# Patient Record
Sex: Male | Born: 1965 | Race: Black or African American | Hispanic: No | Marital: Single | Smoking: Current every day smoker
Health system: Southern US, Community
[De-identification: ages and names within clinical notes are randomized; demographics above are authoritative.]

## PROBLEM LIST (undated history)

## (undated) DIAGNOSIS — I82409 Acute embolism and thrombosis of unspecified deep veins of unspecified lower extremity: Secondary | ICD-10-CM

## (undated) DIAGNOSIS — I2699 Other pulmonary embolism without acute cor pulmonale: Secondary | ICD-10-CM

## (undated) HISTORY — PX: BACK SURGERY: SHX140

---

## 1999-09-02 ENCOUNTER — Emergency Department (HOSPITAL_COMMUNITY): Admission: EM | Admit: 1999-09-02 | Discharge: 1999-09-02 | Payer: Self-pay | Admitting: Emergency Medicine

## 2004-05-06 ENCOUNTER — Emergency Department (HOSPITAL_COMMUNITY): Admission: EM | Admit: 2004-05-06 | Discharge: 2004-05-06 | Payer: Self-pay | Admitting: Emergency Medicine

## 2004-06-09 ENCOUNTER — Emergency Department (HOSPITAL_COMMUNITY): Admission: EM | Admit: 2004-06-09 | Discharge: 2004-06-09 | Payer: Self-pay | Admitting: Emergency Medicine

## 2005-10-06 ENCOUNTER — Emergency Department (HOSPITAL_COMMUNITY): Admission: EM | Admit: 2005-10-06 | Discharge: 2005-10-07 | Payer: Self-pay | Admitting: Emergency Medicine

## 2007-03-06 ENCOUNTER — Emergency Department (HOSPITAL_COMMUNITY): Admission: EM | Admit: 2007-03-06 | Discharge: 2007-03-06 | Payer: Self-pay | Admitting: Family Medicine

## 2007-05-31 ENCOUNTER — Emergency Department (HOSPITAL_COMMUNITY): Admission: EM | Admit: 2007-05-31 | Discharge: 2007-05-31 | Payer: Self-pay | Admitting: Emergency Medicine

## 2007-11-07 ENCOUNTER — Emergency Department (HOSPITAL_COMMUNITY): Admission: EM | Admit: 2007-11-07 | Discharge: 2007-11-07 | Payer: Self-pay | Admitting: Family Medicine

## 2008-01-02 ENCOUNTER — Emergency Department (HOSPITAL_COMMUNITY): Admission: EM | Admit: 2008-01-02 | Discharge: 2008-01-02 | Payer: Self-pay | Admitting: Family Medicine

## 2008-04-19 ENCOUNTER — Encounter: Admission: RE | Admit: 2008-04-19 | Discharge: 2008-04-19 | Payer: Self-pay | Admitting: Internal Medicine

## 2008-07-13 ENCOUNTER — Emergency Department (HOSPITAL_COMMUNITY): Admission: EM | Admit: 2008-07-13 | Discharge: 2008-07-13 | Payer: Self-pay | Admitting: Emergency Medicine

## 2008-07-17 ENCOUNTER — Emergency Department (HOSPITAL_COMMUNITY): Admission: EM | Admit: 2008-07-17 | Discharge: 2008-07-18 | Payer: Self-pay | Admitting: *Deleted

## 2008-08-04 ENCOUNTER — Emergency Department (HOSPITAL_COMMUNITY): Admission: EM | Admit: 2008-08-04 | Discharge: 2008-08-04 | Payer: Self-pay | Admitting: Emergency Medicine

## 2008-08-15 ENCOUNTER — Ambulatory Visit: Payer: Self-pay | Admitting: Internal Medicine

## 2008-08-20 ENCOUNTER — Ambulatory Visit (HOSPITAL_COMMUNITY): Admission: RE | Admit: 2008-08-20 | Discharge: 2008-08-20 | Payer: Self-pay | Admitting: Internal Medicine

## 2008-08-27 ENCOUNTER — Ambulatory Visit: Payer: Self-pay | Admitting: Family Medicine

## 2008-09-05 ENCOUNTER — Inpatient Hospital Stay (HOSPITAL_COMMUNITY): Admission: RE | Admit: 2008-09-05 | Discharge: 2008-09-06 | Payer: Self-pay | Admitting: Neurological Surgery

## 2008-09-05 ENCOUNTER — Encounter (INDEPENDENT_AMBULATORY_CARE_PROVIDER_SITE_OTHER): Payer: Self-pay | Admitting: Neurological Surgery

## 2008-09-10 ENCOUNTER — Inpatient Hospital Stay (HOSPITAL_COMMUNITY): Admission: EM | Admit: 2008-09-10 | Discharge: 2008-09-13 | Payer: Self-pay | Admitting: Emergency Medicine

## 2008-12-26 ENCOUNTER — Encounter: Admission: RE | Admit: 2008-12-26 | Discharge: 2009-03-26 | Payer: Self-pay | Admitting: Neurological Surgery

## 2010-07-18 IMAGING — CR DG LUMBAR SPINE COMPLETE 4+V
5 series · 5 of 5 positions shown · non-contrast
Comparison: None.

CLINICAL DATA: Pain and legs.  No history trauma provided.

LUMBAR SPINE - COMPLETE 4+ VIEW

[t l-spine a.p.]
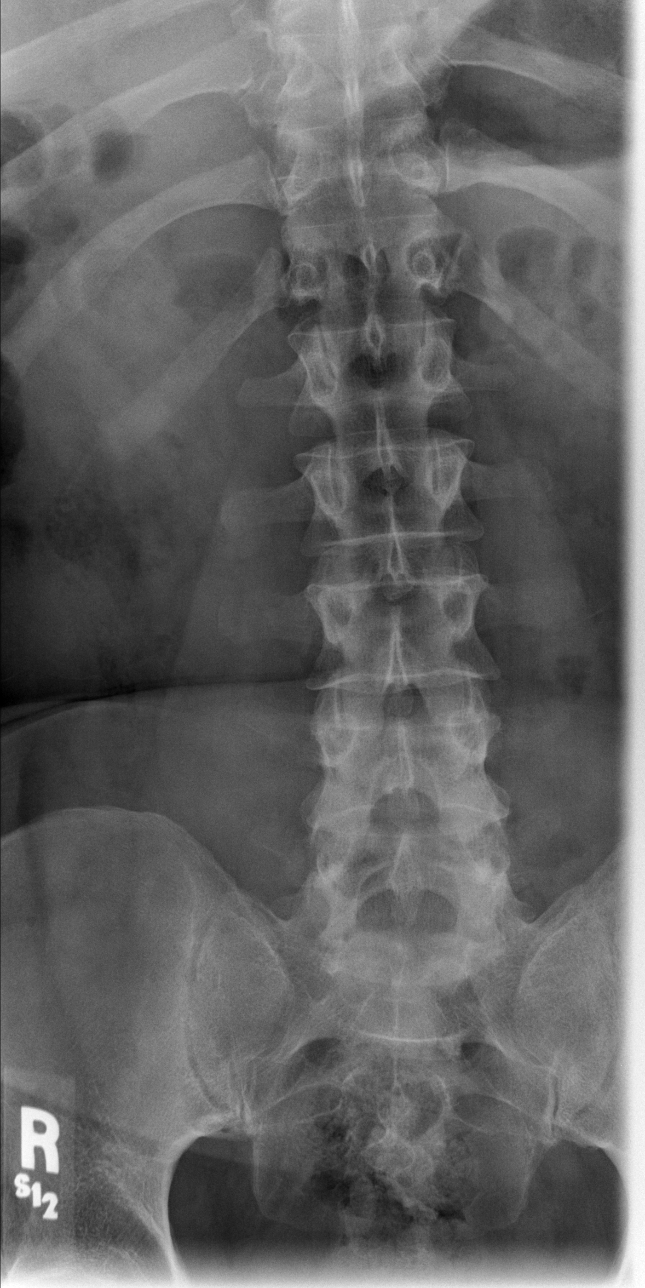

[t l-spine oblique exposure (1 of 2)]
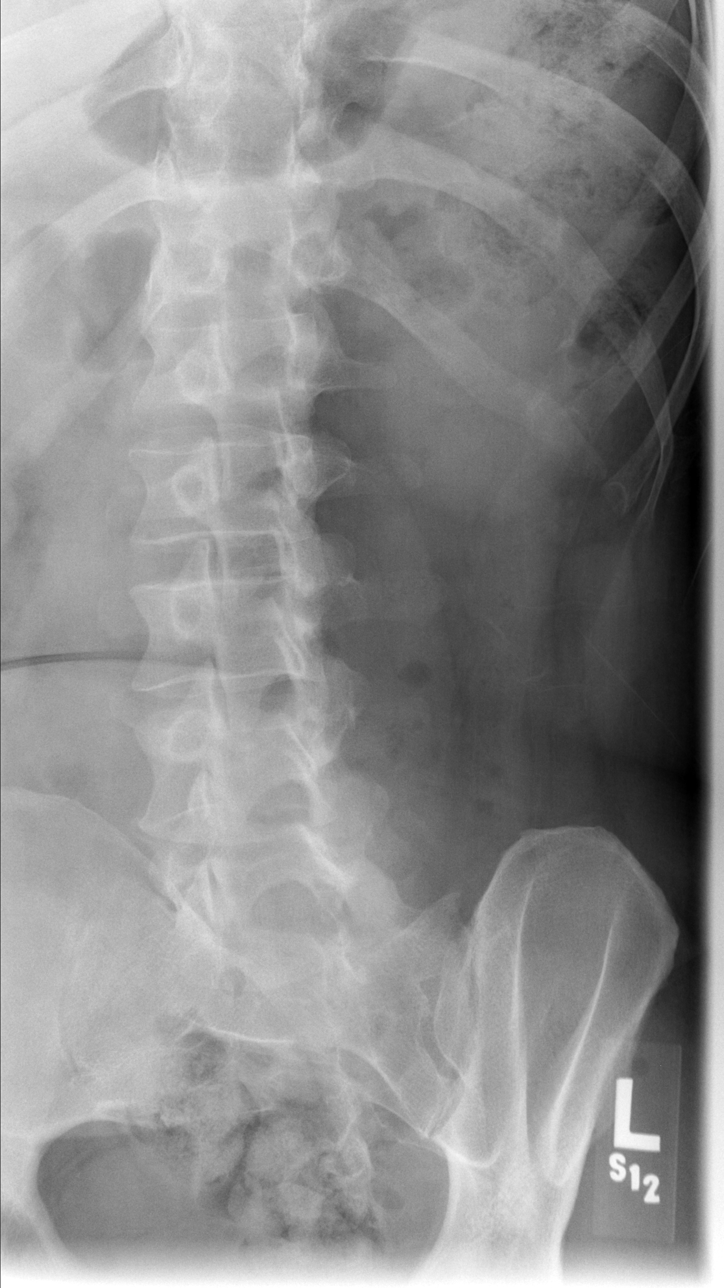

[t l-spine oblique exposure (2 of 2)]
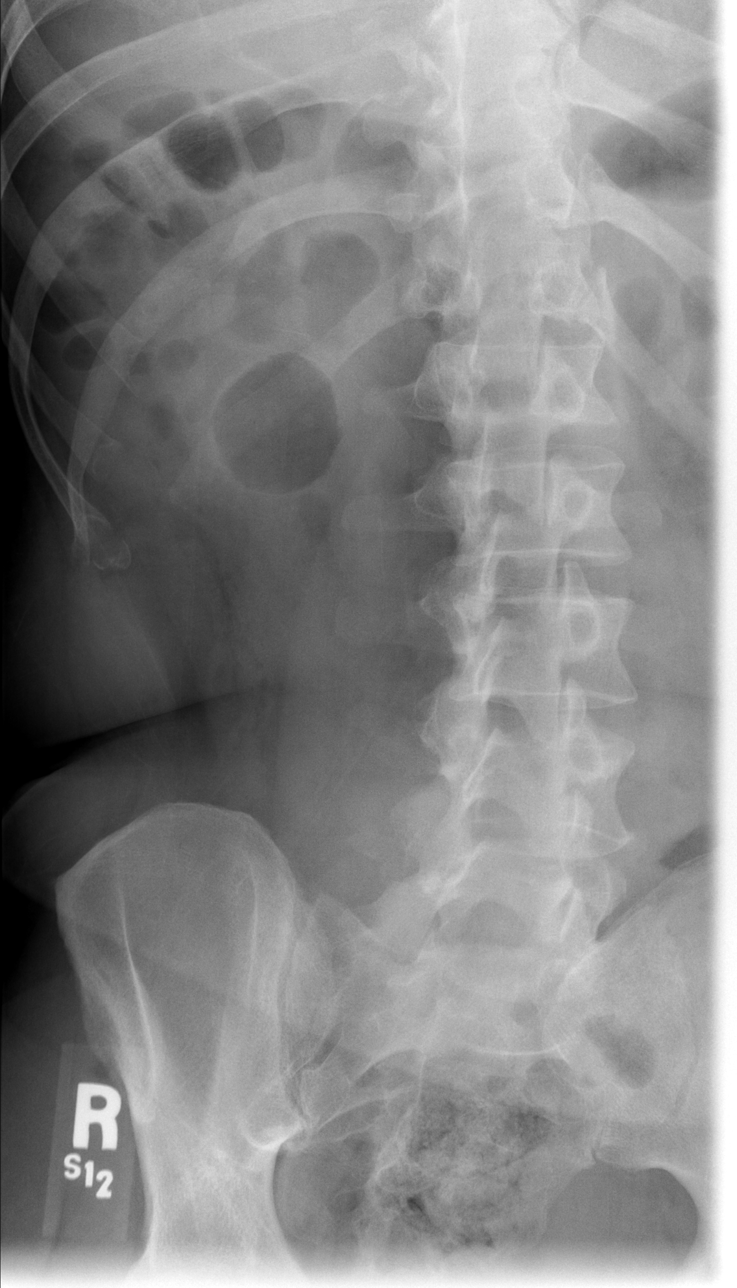

[t l-spine lat]
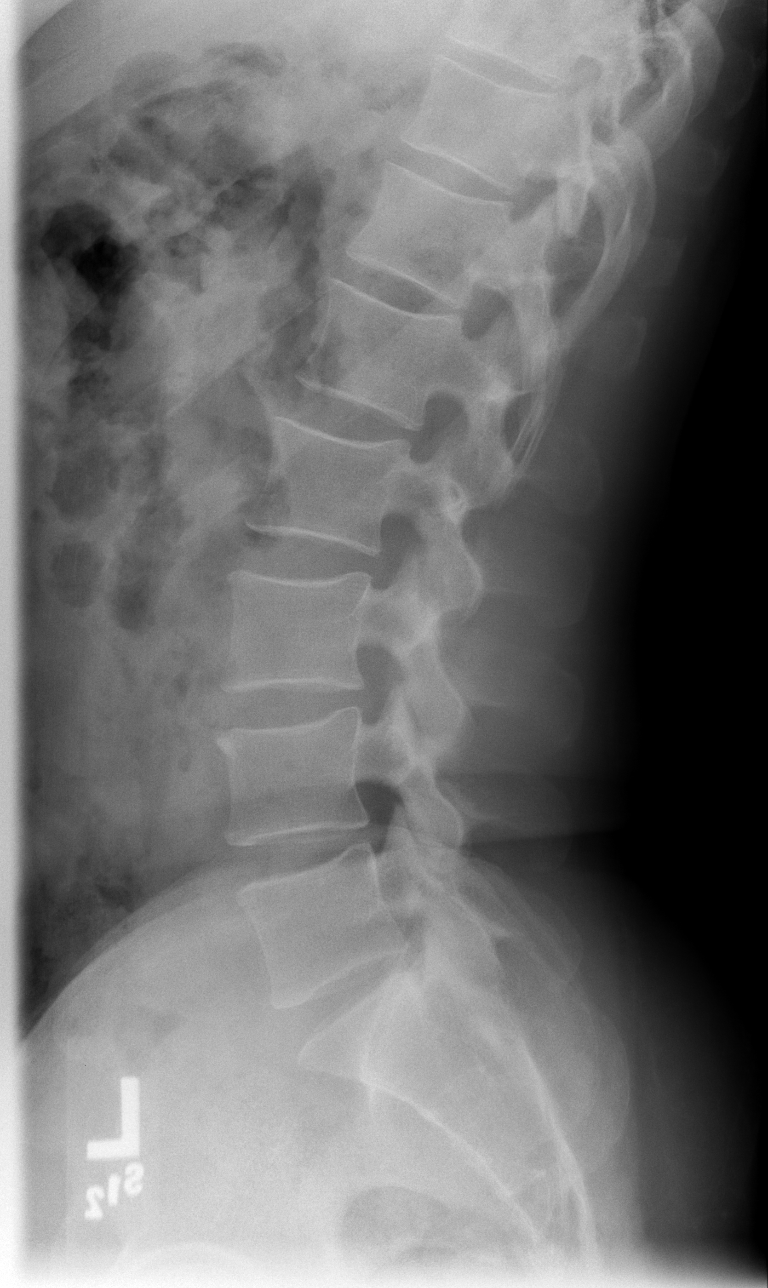

[t l-spine l5-s1 spot]
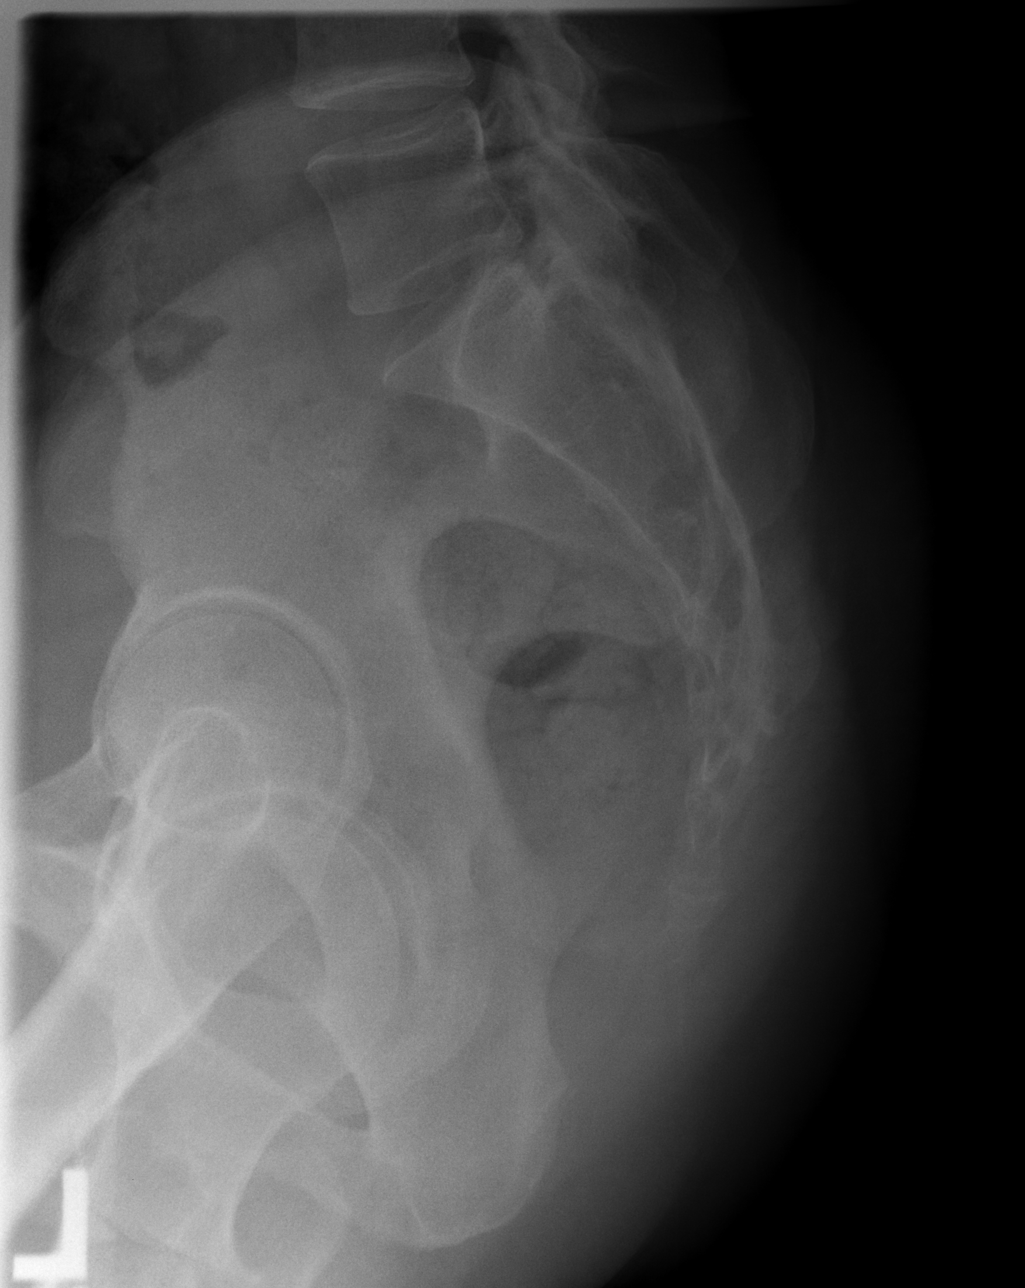

[5 of 5 positions shown; findings below may reference images not displayed]

FINDINGS: Five non-rib-bearing lumbar-type vertebra in normal
alignment.  Minimal L5- S1 disc space narrowing.
IMPRESSION: Minimal L5-S1 disc space narrowing.

## 2011-04-07 NOTE — Op Note (Signed)
Michael Foley, Michael Foley            ACCOUNT NO.:  [id number]   MEDICAL RECORD NO.:  [id number]          PATIENT TYPE:  INP   LOCATION:  3016                         FACILITY:  [facility]   PHYSICIAN:  Stefani Dama, M.D.  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   POSTOPERATIVE DIAGNOSES:  Spinal cord tumor and cauda equina at the  level of L3.   PROCEDURES:  L3 laminectomy, gross total resection of tumor with  operating microscope, microdissection technique.   SURGEON:  Stefani Dama, MD   FIRST ASSISTANT:  Coletta Memos, MD   ANESTHESIA:  General endotracheal.   INDICATIONS:  Michael Foley is a 45 year old individual who has had a  progressive back pain with increasing difficulty walking and weakness in  his lower extremities.  He has had control of bowel and bladder.  He was  found to have a lesion at the level of L3, which filled the spinal canal  and was compressing the nerve roots of the cauda equina.  He was advised  regarding surgical extirpation.   PROCEDURE:  The patient was brought to the operating room supine on the  stretcher.  After smooth induction of general endotracheal anesthesia,  he was turned prone.  The back was prepped with alcohol and DuraPrep and  draped in a sterile fashion.  A midline incision was created in the  lower lumbar spine, carried down to lumbodorsal fascia.  First  identifiable spinous processes were noted radiographically to be that of  L2 and L3.  Interlaminar space at L3 was then dissected fully to expose  the superior border of L4 and the inferior border of L2.  Self-retaining  retractor was placed deep in the wound and the laminectomy was created  removing the spinous process of L3 and using a combination of curettes  and rongeurs to remove the remnants of the  inferior aspect of the  interspinous ligament of L2 and the superior aspect of the interspinous  ligament of L4.  Laminectomy was then completed using a high-speed bur  and acorn bit to shave down the lamina to the tendinous rim of bone.  A  2-mm Kerrison punch was then used to elevate the remnants of bone and  widen the laminectomy to the lateral aspects of the lamina itself.  The  dura was noted to be modestly tense in this area.  After achieving good  hemostasis in the lateral gutters, the dura was opened with the use of  the operating microscope using a #15 blade to score the outer layer of  the dura and then using a Woodson elevator to elevate and open the dura.  In the superior aspect of the incision, there was noted to be  substantial herniation of the nerve roots on the dorsal aspect with  spinal fluid that irrigated self freely.  The dura was then tied back to  the lateral recesses with 4-0 Nurolon sutures, and once  the spinal fluid  pressure had been relieved, the area could be explored and there was  noted to be a ventrally-located tumor that appeared bluish gray in color  and largely appeared cystic.  It was ballotable and it could be  mobilized from the superior aspect inferiorly.  On the right lateral  aspect, on the ventral aspect, there was noted to be some nerve roots  that appeared to be included in the lateral wall of this lesion.  These  were carefully dissected from the superior aspect; however, one nerve  root particularly was noted to be embedded within the lesion itself.  During the dissection, the cyst portion of this lesion had decompressed  itself.  Care was taken to confine the contents and irrigate them away.  The cyst wall was then carefully dissected free from the surrounding  nerve roots in all, but one of the centrally located nerve roots to be  dissected free.  This nerve root was sacrificed with the tumor, which  was then sent for diagnostic evaluation.   In the end, the area around  the nerve roots was explored carefully.  The remnants of the tumor and  tumor wall was identified.  Once the resection was felt to be grossly  and totally completed, then the dura was closed under the microscope  with 6-0 Prolene sutures in a running fashion.  Several Valsalva  maneuvers identified.  A small leak, which was closed with another 6-0  Prolene.  In the end, the wound was copiously irrigated with saline.  The lumbodorsal fascia was then reapproximated with #1 Vicryl in  interrupted fashion, 2-0 Vicryl was used in subcutaneous tissues, 3-0  Vicryl subcuticularly.  Blood loss for the procedure was estimated 100  mL.      Stefani Dama, M.D.  Electronically Signed     HJE/MEDQ  D:  09/05/2008  T:  09/06/2008  Job:  [id number]

## 2011-04-07 NOTE — Consult Note (Signed)
Michael Foley, Michael Foley            ACCOUNT NO.:  [id number]   MEDICAL RECORD NO.:  [id number]          PATIENT TYPE:  INP   LOCATION:  3040                         FACILITY:  [facility]   PHYSICIAN:  Melvyn Novas, M.D.  DATE OF BIRTH:  1966-03-28   DATE OF CONSULTATION:  DATE OF DISCHARGE:                                 CONSULTATION   This patient presented on September 10, 2008 at 9:30 p.m. to the [facility] Emergency Room with a severe headache by the ER physician called as  a spinal headache.  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  He had 2 or 3 years ago a pulmonary embolism and has  no other significant past medical history.  He describes that since  Wednesday last week when he underwent surgery on September 05, 2008, with  a spinal laminectomy and schwannomectomy, he had been on headaches every  day.  The headaches were worsened whenever he sat up or stood up, also  he tried to keep a lot of oral fluids down, it seems not to make a  difference.  He took Vicodin, Robaxin, and prednisone at home the  medication he was discharged with.  There has been no H and P dictated  on the e-chart that I can rely on most of the information now comes from  the patient's verbal information.  There is no detailed H and P  handwritten in the chart available from his primary team either.   SOCIAL HISTORY:  The patient lives transiently with his parents.  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  He was seen at [facility] for back  pain and lower extremity pain when the schwannoma was found.  He was  referred to Dr. Danielle Dess who did the laminectomy on September 05, 2008.  He  is a nondrinker and nonsmoker.  He states he has not used illegal drugs.  He used to smoke until some other year, but when his back pain became  worse, he  stopped.   Family history is positive for hypertension.   The medication as listed above, currently only on IV fluids and Vicodin.  The patient is presenting not just with worsening headaches, but in his  history of present illness also reports that yesterday, he developed  left arm and hand twitching movements, left face twitching movements  that he could not control that lasted 2-3 minutes, followed by a 20 days  or partially amnestic spell that witnessed by both of his parents.    It was presumed that the patient had suffered a focal seizure.  It did  not secondary generalized according to the family's description.  There  has been no seizure history for this patient.   PHYSICAL EXAMINATION:  The patient has a pulse of 92, respirations of  18, systolic blood pressure is 118/72, and oxygen is on room air between  95 and 98.  The patient's capillary blood sugars were not taken also he  still on prednisone.  Orthostatic blood pressures could not be taken, he  is on bedrest.  The patient is well groomed, pleasant, cooperative,  resting on his back.  He is not in acute distress, but states that his  headaches have reoccurred every time he tries to sit up just to take a  sip of order or when he tries to walk to the bathroom by himself.  He  has been on bed rest since his admission last night.  Lungs are clear to  auscultation.  Regular rate and rhythm.  No carotid bruit.  No temporal  artery intubation.  There is occipital and nuchal pain when he tries to  lift his head off, but yet no rigidity and no meningismus noted.  The  patient's lower back is poor.  He has normal pulses in the lower  extremities and upper extremities.  Cranial nerve examination shows no  deficits.  Full extraocular movements, full visual fields.  Facial  structures are symmetric.  Facial sensory is preserved.  Tongue and  uvula are midline.  No evidence of tongue bite.  Shoulder shrug is  bilaterally equal.   Bilaterally equal is also the grip strength.  Arm  extension, finger-nose-finger testing shows no dysmetria or tremor.  The  patient can lift either leg for over 10 seconds without drift.  He has  good plantar flexion and dorsal flexion.  He also describes no primary  modality loss in either hands, or feet, or trunk.   LABORATORY DATA:  CBC with differential was reviewed.  White blood cell  count is 9.7, hemoglobin and hematocrit 14.4 over 41.9, platelet count  231, neutrophils 84 slightly elevated, lymphocytes 10 slightly  decreased.  The patient's metabolic panel was a basic metabolic panel.  Sodium 139, potassium 4.9, chloride 103, CO2 29, glucose 87, BUN 17.  This was drawn in the afternoon of September 04, 2008 preoperatively.  Looking at the September 10, 2008 admission, his glucose was 045, but  certainly not a fasting glucose.  Sodium, potassium, chloride, and  creatinine were all in normal limits.  Creatinine 1.04, BUN was 12,  calcium was 8.8, magnesium was 2.3.   The patient is currently on dextrose 5% mixed with magnesium chloride,  normal saline.  Influenza vaccination was performed here.  Upon  admission, he has Tylenol and Vicodin p.r.n.  Vistaril hydroxyzine is  ordered as a p.o. q.3 h. medication and he is on a laxative.  His weight  is 83 kg.  His height, however, is given an inches, 70 inches.   ASSESSMENT:  The patient has a post spinal headache.  This may be  related through a spinal leak.  This evaluation I will defer to his  neurosurgical team secondary a single seizure should not be treated with  an antiepileptic drug.  The patient has no sign of meningismus, not  febrile, is alert and oriented, and shows no persistent neurologic focal  deficits.  Seizures could be provoked by sleep deprivation by taking  pain medications such as Ultram or by an allergic reaction to some  medications.  I am not sure if Vicodin and Robaxin could have this  effect on the patient;  however, I would not be calling this an  unprovoked seizure given his medical condition.  I ordered for today an  EEG.  I would consider an MRI of the spine if he has a second spell.  In  that case, I would also start him on Keppra.  For right now, I do not  order any and antiepileptic drugs or imaging study and we will have the  EEG only.  I will ask to physical therapy to gently start ambulating  with the patient.  I agree with the 100 mL per hour hydration from  normal saline and 5% dextrose.  I suggested to place the patient put on  night on Valium or Klonopin basically to reduce lower back spasm.  This  of course has a side effect of reducing the likelihood of a seizure.  This should be a temporary measure at 10-14 days and we will hopefully  also give the patient some relief from his insomnia.      Melvyn Novas, M.D.  Electronically Signed     CD/MEDQ  D:  09/11/2008  T:  09/12/2008  Job:  [id number]   cc:   Stefani Dama, M.D.

## 2011-04-07 NOTE — Discharge Summary (Signed)
[name], [name]            ACCOUNT NO.:  [id number]   MEDICAL RECORD NO.:  [id number]          PATIENT TYPE:  INP   LOCATION:  3040                         FACILITY:  [facility]   PHYSICIAN:  Stefani Dama, M.D.  DATE OF BIRTH:  04/11/1966   DATE OF ADMISSION:  09/10/2008  DATE OF DISCHARGE:  09/13/2008                               DISCHARGE SUMMARY   ADMITTING DIAGNOSIS:  Seizure-like episode, 5 days, status post lumbar  laminectomy for resection of schwannoma.   HISTORY OF PRESENT ILLNESS:  Mr. Michael Foley is a 45 year old  individual who has been living with his family after having had weakness  in his lower extremities that was found to be a mass at the level of L3.  He was admitted to the hospital about 5 days ago for surgical excision  of this mass, which was found to be a schwannoma, tolerated the surgery  well on following day.  He was ambulatory in the following day and after  that, he was discharged home.  He apparently had a seizure-like episode  that was a witness at the house where his eyes rolled back in his head  and the patient became unresponsive.  It was noted that he was shaking  his extremities on the left side more than his right.  He had no  evidence of a tongue laceration.  He had no focal deficits.  He had no  evidence of incontinence.  When he was evaluated in the emergency room,  he appeared normal.  He was admitted for evaluation and IV hydration.  His vital signs were stable throughout the hospitalization.  He did not  have what appeared to be a vagal or hypotensive episode.  The patient  did complain of some earlier headaches and there was concern that he may  have had a spinal fluid leak, but the incision has remained dry, flat  without any evidence of bulging or protuberance.  During the  hospitalization, the patient was seen by the Neurology Service.  An EEG  was performed, which showed that he had some slight slowing with  question of  epileptiform activity in a drowsy state, but no frank  epileptiform activity.  Based on the EEG, no further intervention was  suggested.  He has been ambulatory and stable on his feet.  His IV has  been discontinued at this time.  He is not having any headache.  He is  discharged home to the care of his family.  He has been advised to  refrain from any stimulants or alcohol, though it does not appear that  this was involved in provoking this most recent episode.  He will be  seen for his regularly scheduled appointment.  He has been advised to  continue use in the pain medication that he was given at the time of  discharge which includes oxycodone, but if the pain is not that severe,  he may use Advil or Aleve.   CONDITION ON DISCHARGE:  Improving.      Stefani Dama, M.D.  Electronically Signed     HJE/MEDQ  D:  09/13/2008  T:  09/14/2008  Job:  [id number]

## 2011-04-07 NOTE — H&P (Signed)
NAMEJAMARQUIS, CRULL            ACCOUNT NO.:  [id number]   MEDICAL RECORD NO.:  [id number]          PATIENT TYPE:  INP   LOCATION:  3040                         FACILITY:  [facility]   PHYSICIAN:  Hewitt Shorts, M.D.DATE OF BIRTH:  Jul 17, 1966   DATE OF ADMISSION:  09/10/2008  DATE OF DISCHARGE:                              HISTORY & PHYSICAL   HISTORY OF PRESENT ILLNESS:  The patient is a 45 year old black male who  is 5 days status post a lumbar laminectomy and resection of an  intradural schwannoma by my partner Dr. Barnett Abu.  The patient was  discharged on the first postoperative day with prescription of Vicodin,  Relafen, and Soma.  However, he has had difficulties with positional  headache, 2 days ago he found that when he was up he developed headache  and neck pain, it was eased when he would lay down.  He contacted Dr.  Venetia Maxon who recommended that he be on bed rest and contact Dr. Danielle Dess  today.  The patient spoke with Dr. Verlee Rossetti physician assistant today,  but because of increasing difficulty this evening, his mother whom he  stays with contacted EMS and was brought to the [facility] emergency room by EMS complaining of increasingly disabling  headache and neck pain.   PAST MEDICAL HISTORY:  Notable for history of pulmonary embolism a  couple of years ago, but no history of hypertension, myocardial  infarction, cancer, stroke, diabetes, or peptic ulcer disease.  His only  previous surgery was his surgery 5 days ago by Dr. Danielle Dess.   ALLERGIES:  He denies allergic to medications.   CURRENT MEDICATIONS:  His current medication include just his discharge  medication including:  1. Vicodin.  2. Relafen.  3. Soma.   FAMILY HISTORY:  Noncontributory.   SOCIAL HISTORY:  The patient is unmarried.  He lives with his mother.  He smokes and drinks alcoholic beverages on a limited basis.   REVIEW OF SYSTEMS:  Notable for those described in the history  of  present illness and past medical history, but is otherwise unremarkable.  He has been eating well.  He denies any nausea or vomiting.  He denies  any fever.  He denies any drainage from his wound.   PHYSICAL EXAMINATION:  GENERAL:  The patient is a well-developed, well-  nourished black male in no acute distress.  VITAL SIGNS:  Temperature is 99.2, pulse 100, and blood pressure 114/73.  LUNGS:  Clear to auscultation.  He has symmetrical respiratory  excursion.  HEART:  Regular rate and rhythm.  Normal S1 and S2.  There is no murmur.  ABDOMEN:  Soft, nondistended, and nontender.  Bowels sound are present.  EXTREMITIES:  No clubbing, cyanosis, or edema.  Examination of his incision shows good healing without evidence of  swelling, erythema, or drainage.  NEUROLOGIC:  5/5 strength to distal lower extremities including the  dorsiflexors, extensor hallucis longus and plantar flexion bilaterally.  Sensation is intact.  Reflexes are symmetrical.  Gait and stance were  not tested due to the nature of his condition.   IMPRESSION:  Post laminectomy, spinal headache with persistent symptoms  despite several days of bed rest.   PLAN:  The patient will be admitted to the 3000 unit, will be placed on  bed rest with his head a bit flat, allowed to logroll side-to-side q.2  h. PAS boots will be placed.  Dr. Danielle Dess has been notified of his  admission and will plan on seeing the patient in the morning.      Hewitt Shorts, M.D.  Electronically Signed     RWN/MEDQ  D:  09/10/2008  T:  09/11/2008  Job:  [id number]

## 2011-04-07 NOTE — Procedures (Signed)
EEG NUMBER:  [id number]   Currently, under the care of Dr. Danielle Dess, Neurosurgical Services in room  3040 at [facility].  The patient's EEG was written on September 11, 2008, and is a portable study for this awake and oriented African  American gentleman, right-handed who describes possible left-sided  seizure witnessed by his family namely his parents at home 10 days  postlaminectomy for a schwannoma.  He also has severe spinal headaches  and he took various pain medications.   Currently the patient is on Vicodin, Tylenol, Vistaril, methocarbamol,  and nabumetone.  He was not exposed to hyperventilation or photic  stimulation maneuver, 16-channel EEG recording with one channel  representing heart rate and rhythm exclusively.   A posterior dominant background rhythm is seen at 9 Hz and is well-  formed seen in both posterior hemispheres.  There is a clear posterior  dominant background.  There were frequent eye movement abnormalities  causing artifacts in the EEG recording seen.  As the study progresses, I  am concerned about possible vertex sharp wave discharges in the S4-C4  regions and to a slightly lesser amplitude in the S3-C3 region.  These  appear to be vertex sharp waves in this patient to reach drowsiness and  finally went to sleep with symmetric sleep architecture.  Again, I see  no hyperventilation or photic stimulation maneuvers being recorded.  I  will ask my colleague Dr. Ellison Carwin to review this EEG for me as  I am unclear as to the benign or non benign nature of the bifrontal  discharges.  At this time, I would consider this to be an EEG normal in  awake and sleep stages, but possibly epileptiform discharges in drowsy  stages.   Please note that I will ask colleague to review this EEG for me before I  can dictate a final conclusion.      Melvyn Novas, M.D.  Electronically Signed     YN:WGNF  D:  09/12/2008 08:51:44  T:  09/12/2008 10:06:35  Job  #:  [id number]   cc:   Stefani Dama, M.D.  Fax: [phone]

## 2011-08-24 LAB — DIFFERENTIAL
Basophils Absolute: 0
Basophils Relative: 0
Eosinophils Absolute: 0.1
Eosinophils Relative: 1
Lymphocytes Relative: 10 — ABNORMAL LOW
Lymphs Abs: 1
Monocytes Absolute: 0.4
Monocytes Relative: 4
Neutro Abs: 8.2 — ABNORMAL HIGH
Neutrophils Relative %: 84 — ABNORMAL HIGH

## 2011-08-24 LAB — BASIC METABOLIC PANEL
BUN: 12
BUN: 17
CO2: 28
Calcium: 10
Calcium: 8.8
Chloride: 103
Chloride: 109
Creatinine, Ser: 1.04
GFR calc Af Amer: >60
GFR calc non Af Amer: >60
Glucose, Bld: 103 — ABNORMAL HIGH
Potassium: 3.7
Sodium: 139
Sodium: 142

## 2011-08-24 LAB — CBC
HCT: 41.9
Hemoglobin: 14.4
Hemoglobin: 17
MCHC: 34.3
MCHC: 34.5
MCV: 83.9
MCV: 84.2
Platelets: 231
RBC: 4.98
RDW: 12.7
WBC: 9.7

## 2011-08-28 LAB — RPR: RPR Ser Ql: NONREACTIVE

## 2013-08-14 ENCOUNTER — Ambulatory Visit: Payer: Self-pay | Admitting: Family Medicine

## 2013-08-14 VITALS — BP 132/82 | HR 84 | Temp 98.7°F | Resp 20 | Ht 69.75 in | Wt 197.2 lb

## 2013-08-14 DIAGNOSIS — Z0289 Encounter for other administrative examinations: Secondary | ICD-10-CM

## 2013-08-14 DIAGNOSIS — Z720 Tobacco use: Secondary | ICD-10-CM | POA: Insufficient documentation

## 2013-08-14 DIAGNOSIS — Z0000 Encounter for general adult medical examination without abnormal findings: Secondary | ICD-10-CM
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
## 2013-08-16 ENCOUNTER — Encounter: Payer: Self-pay | Admitting: Family Medicine

## 2015-02-25 ENCOUNTER — Encounter (HOSPITAL_COMMUNITY): Payer: Self-pay | Admitting: Emergency Medicine

## 2015-02-25 ENCOUNTER — Encounter (HOSPITAL_COMMUNITY): Payer: Self-pay | Admitting: *Deleted

## 2015-02-25 ENCOUNTER — Emergency Department (INDEPENDENT_AMBULATORY_CARE_PROVIDER_SITE_OTHER)
Admission: EM | Admit: 2015-02-25 | Discharge: 2015-02-25 | Disposition: A | Discharge status: Nursing Home (Includes ICF) | Payer: Self-pay | Source: Home / Self Care | Attending: Family Medicine | Admitting: Family Medicine

## 2015-02-25 ENCOUNTER — Emergency Department (HOSPITAL_COMMUNITY)
Admission: EM | Admit: 2015-02-25 | Discharge: 2015-02-25 | Disposition: A | Discharge status: Home / Self Care | Payer: No Typology Code available for payment source | Attending: Emergency Medicine | Admitting: Emergency Medicine

## 2015-02-25 DIAGNOSIS — Z720 Tobacco use: Secondary | ICD-10-CM | POA: Insufficient documentation

## 2015-02-25 DIAGNOSIS — S8991XA Unspecified injury of right lower leg, initial encounter: Secondary | ICD-10-CM | POA: Diagnosis present

## 2015-02-25 DIAGNOSIS — S86811A Strain of other muscle(s) and tendon(s) at lower leg level, right leg, initial encounter: Secondary | ICD-10-CM | POA: Insufficient documentation

## 2015-02-25 DIAGNOSIS — Y92410 Unspecified street and highway as the place of occurrence of the external cause: Secondary | ICD-10-CM | POA: Insufficient documentation

## 2015-02-25 DIAGNOSIS — S86111A Strain of other muscle(s) and tendon(s) of posterior muscle group at lower leg level, right leg, initial encounter: Secondary | ICD-10-CM

## 2015-02-25 DIAGNOSIS — Y998 Other external cause status: Secondary | ICD-10-CM | POA: Diagnosis not present

## 2015-02-25 DIAGNOSIS — Y9389 Activity, other specified: Secondary | ICD-10-CM | POA: Insufficient documentation

## 2015-02-25 DIAGNOSIS — Z86711 Personal history of pulmonary embolism: Secondary | ICD-10-CM | POA: Insufficient documentation

## 2015-02-25 DIAGNOSIS — M7989 Other specified soft tissue disorders: Secondary | ICD-10-CM

## 2015-02-25 DIAGNOSIS — Z86718 Personal history of other venous thrombosis and embolism: Secondary | ICD-10-CM | POA: Diagnosis not present

## 2015-02-25 HISTORY — DX: Acute embolism and thrombosis of unspecified deep veins of unspecified lower extremity: I82.409

## 2015-02-25 HISTORY — DX: Other pulmonary embolism without acute cor pulmonale: I26.99

## 2015-02-25 MED ORDER — TRAMADOL HCL 50 MG PO TABS
50.0000 mg | ORAL_TABLET | Freq: Four times a day (QID) | ORAL | Status: AC | PRN
Start: 2015-02-25 — End: ?

## 2015-02-25 MED ORDER — NAPROXEN 500 MG PO TABS: 500.0000 mg | ORAL_TABLET | Freq: Two times a day (BID) | ORAL | Status: AC
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
## 2015-02-25 NOTE — Discharge Instructions (Signed)
Ice several times a day. Elevate. ACE wrap for compression. Naprosyn for pain and inflammation. Tramadol for severe pain. Follow up with primary care doctor.
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
## 2015-02-25 NOTE — Progress Notes (Signed)
VASCULAR LAB PRELIMINARY  PRELIMINARY  PRELIMINARY  PRELIMINARY  Right lower extremity venous duplex completed.    Preliminary report:  Right:  No evidence of DVT, superficial thrombosis, or Baker's cyst.  Small muscle tear noted in the proximal calf.  Ellinore Merced, RVT 02/25/2015, 3:35 PM
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
## 2023-07-05 ENCOUNTER — Emergency Department (HOSPITAL_COMMUNITY): Payer: 59

## 2023-07-05 ENCOUNTER — Encounter (HOSPITAL_COMMUNITY): Payer: Self-pay | Admitting: Emergency Medicine

## 2023-07-05 ENCOUNTER — Emergency Department (HOSPITAL_COMMUNITY)
Admission: EM | Admit: 2023-07-05 | Discharge: 2023-07-05 | Disposition: A | Discharge status: Home / Self Care | Payer: 59 | Attending: Emergency Medicine | Admitting: Emergency Medicine

## 2023-07-05 DIAGNOSIS — R0602 Shortness of breath: Secondary | ICD-10-CM | POA: Insufficient documentation

## 2023-07-05 DIAGNOSIS — F1490 Cocaine use, unspecified, uncomplicated: Secondary | ICD-10-CM | POA: Diagnosis present

## 2023-07-05 LAB — CBC
HCT: 37.3 % — ABNORMAL LOW (ref 39.0–52.0)
Hemoglobin: 13.6 g/dL (ref 13.0–17.0)
MCH: 28.4 pg (ref 26.0–34.0)
MCHC: 36.5 g/dL — ABNORMAL HIGH (ref 30.0–36.0)
MCV: 77.9 fL — ABNORMAL LOW (ref 80.0–100.0)
Platelets: 241 10*3/uL (ref 150–400)
RBC: 4.79 MIL/uL (ref 4.22–5.81)
RDW: 14 % (ref 11.5–15.5)
WBC: 7.7 10*3/uL (ref 4.0–10.5)
nRBC: 0 % (ref 0.0–0.2)

## 2023-07-05 LAB — TROPONIN I (HIGH SENSITIVITY)
Troponin I (High Sensitivity): 10 ng/L (ref <18)
Troponin I (High Sensitivity): 10 ng/L (ref <18)

## 2023-07-05 LAB — BASIC METABOLIC PANEL
Anion gap: 15 (ref 5–15)
BUN: 8 mg/dL (ref 6–20)
CO2: 23 mmol/L (ref 22–32)
Calcium: 8.7 mg/dL — ABNORMAL LOW (ref 8.9–10.3)
Chloride: 97 mmol/L — ABNORMAL LOW (ref 98–111)
Creatinine, Ser: 1.08 mg/dL (ref 0.61–1.24)
GFR, Estimated: >60 mL/min (ref >60)
Glucose, Bld: 101 mg/dL — ABNORMAL HIGH (ref 70–99)
Potassium: 2.8 mmol/L — ABNORMAL LOW (ref 3.5–5.1)
Sodium: 135 mmol/L (ref 135–145)

## 2023-07-05 MED ORDER — POTASSIUM CHLORIDE CRYS ER 20 MEQ PO TBCR
40.0000 meq | EXTENDED_RELEASE_TABLET | Freq: Once | ORAL | Status: AC
  Administered 2023-07-05: 40 meq via ORAL
  Filled 2023-07-05: qty 2

## 2023-07-05 NOTE — ED Provider Triage Note (Signed)
Emergency Medicine Provider Triage Evaluation Note  Michael Foley , a 57 y.o. male  was evaluated in triage.  Pt complains of cocaine use. States he felt fine until he began smoking crack cocaine immediately prior to arrival today. After smoking began to feel anxious and 'tightness in my chest.' Prior to entering the room, patient appears to be resting. After I enter the room patient begins rapid breathing. Does state that he feels better than when EMS was called.  Review of Systems  Positive:  Negative:   Physical Exam  BP (!) 157/99 (BP Location: Right Arm)   Pulse (!) 106   Temp 98.4 F (36.9 C) (Oral)   Resp 18   SpO2 99%  Gen:   Awake, no distress   Resp:  Normal effort  MSK:   Moves extremities without difficulty  Other:    Medical Decision Making  Medically screening exam initiated at 4:20 PM.  Appropriate orders placed.  [name] was informed that the remainder of the evaluation will be completed by another provider, this initial triage assessment does not replace that evaluation, and the importance of remaining in the ED until their evaluation is complete.     Silva Bandy, PA-C 07/05/23 1627
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
## 2023-07-05 NOTE — Discharge Instructions (Addendum)
Please avoid smoking or using cocaine, which can cause problems with your breathing, I can affect your heart and lung health.
